# Patient Record
Sex: Male | Born: 2000 | Race: Black or African American | Hispanic: No | Marital: Single | Smoking: Never smoker
Health system: Southern US, Community
[De-identification: ages and names within clinical notes are randomized; demographics above are authoritative.]

## PROBLEM LIST (undated history)

## (undated) DIAGNOSIS — L309 Dermatitis, unspecified: Secondary | ICD-10-CM

---
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
## 2023-05-06 ENCOUNTER — Ambulatory Visit: Payer: Self-pay | Admitting: Adult Health

## 2023-05-06 NOTE — Progress Notes (Deleted)
    Patient presents to clinic today to establish care. He is a pleasant 23 year old male who  has a past medical history of Eczema.    Acute Concerns:   Chronic Issues:   Health Maintenance: Dental -- Vision -- Immunizations --    Past Medical History:  Diagnosis Date   Eczema     No past surgical history on file.  No current outpatient medications on file prior to visit.   No current facility-administered medications on file prior to visit.    Allergies  Allergen Reactions   Penicillins Hives   Shrimp [Shellfish Allergy] Swelling    Mom thinks he was given an epi pen but does not have one    No family history on file.  Social History   Socioeconomic History   Marital status: Single    Spouse name: Not on file   Number of children: Not on file   Years of education: Not on file   Highest education level: Not on file  Occupational History   Not on file  Tobacco Use   Smoking status: Never   Smokeless tobacco: Not on file  Substance and Sexual Activity   Alcohol use: Not on file   Drug use: Not on file   Sexual activity: Not on file  Other Topics Concern   Not on file  Social History Narrative   Not on file   Social Determinants of Health   Financial Resource Strain: Not on file  Food Insecurity: Not on file  Transportation Needs: Not on file  Physical Activity: Not on file  Stress: Not on file  Social Connections: Not on file  Intimate Partner Violence: Not on file    ROS  There were no vitals taken for this visit.  Physical Exam  No results found for this or any previous visit (from the past 2160 hour(s)).  Assessment/Plan: No problem-specific Assessment & Plan notes found for this encounter.
